# Patient Record
Sex: Female | Born: 1995 | Hispanic: Yes | Marital: Single | Smoking: Former smoker
Health system: Southern US, Community
[De-identification: ages and names within clinical notes are randomized; demographics above are authoritative.]

## PROBLEM LIST (undated history)

## (undated) DIAGNOSIS — A749 Chlamydial infection, unspecified: Secondary | ICD-10-CM

---

## 2017-09-21 ENCOUNTER — Other Ambulatory Visit: Payer: Self-pay

## 2017-09-21 ENCOUNTER — Emergency Department (HOSPITAL_COMMUNITY): Payer: Medicaid Other

## 2017-09-21 ENCOUNTER — Encounter (HOSPITAL_COMMUNITY): Payer: Self-pay | Admitting: *Deleted

## 2017-09-21 ENCOUNTER — Emergency Department (HOSPITAL_COMMUNITY)
Admission: EM | Admit: 2017-09-21 | Discharge: 2017-09-22 | Disposition: A | Discharge status: Home / Self Care | Payer: Medicaid Other | Attending: Emergency Medicine | Admitting: Emergency Medicine

## 2017-09-21 DIAGNOSIS — R1011 Right upper quadrant pain: Secondary | ICD-10-CM | POA: Diagnosis not present

## 2017-09-21 DIAGNOSIS — R109 Unspecified abdominal pain: Secondary | ICD-10-CM

## 2017-09-21 LAB — URINALYSIS, MICROSCOPIC (REFLEX): BACTERIA UA: NONE SEEN

## 2017-09-21 LAB — COMPREHENSIVE METABOLIC PANEL
ALBUMIN: 3.3 g/dL — AB (ref 3.5–5.0)
ALT: 11 U/L — ABNORMAL LOW (ref 14–54)
ANION GAP: 6 (ref 5–15)
AST: 23 U/L (ref 15–41)
Alkaline Phosphatase: 63 U/L (ref 38–126)
BUN: 6 mg/dL (ref 6–20)
CO2: 26 mmol/L (ref 22–32)
Calcium: 8.4 mg/dL — ABNORMAL LOW (ref 8.9–10.3)
Chloride: 104 mmol/L (ref 101–111)
Creatinine, Ser: 0.68 mg/dL (ref 0.44–1.00)
GFR calc Af Amer: >60 mL/min (ref >60)
GFR calc non Af Amer: >60 mL/min (ref >60)
GLUCOSE: 71 mg/dL (ref 65–99)
POTASSIUM: 3.3 mmol/L — AB (ref 3.5–5.1)
SODIUM: 136 mmol/L (ref 135–145)
Total Bilirubin: 0.4 mg/dL (ref 0.3–1.2)
Total Protein: 6.4 g/dL — ABNORMAL LOW (ref 6.5–8.1)

## 2017-09-21 LAB — CBC
HEMATOCRIT: 35.8 % — AB (ref 36.0–46.0)
HEMOGLOBIN: 11.4 g/dL — AB (ref 12.0–15.0)
MCH: 28.1 pg (ref 26.0–34.0)
MCHC: 31.8 g/dL (ref 30.0–36.0)
MCV: 88.2 fL (ref 78.0–100.0)
Platelets: 218 10*3/uL (ref 150–400)
RBC: 4.06 MIL/uL (ref 3.87–5.11)
RDW: 12.1 % (ref 11.5–15.5)
WBC: 10.3 10*3/uL (ref 4.0–10.5)

## 2017-09-21 LAB — URINALYSIS, ROUTINE W REFLEX MICROSCOPIC
BILIRUBIN URINE: NEGATIVE
Glucose, UA: 50 mg/dL — AB
Ketones, ur: NEGATIVE mg/dL
NITRITE: NEGATIVE
PH: 6 (ref 5.0–8.0)
Protein, ur: 100 mg/dL — AB
SPECIFIC GRAVITY, URINE: 1.009 (ref 1.005–1.030)

## 2017-09-21 LAB — LIPASE, BLOOD: LIPASE: 29 U/L (ref 11–51)

## 2017-09-21 LAB — I-STAT BETA HCG BLOOD, ED (MC, WL, AP ONLY): I-stat hCG, quantitative: <5 m[IU]/mL (ref <5)

## 2017-09-21 MED ORDER — MORPHINE SULFATE (PF) 4 MG/ML IV SOLN
4.0000 mg | Freq: Once | INTRAVENOUS | Status: AC
  Administered 2017-09-21: 4 mg via INTRAVENOUS
  Filled 2017-09-21: qty 1

## 2017-09-21 MED ORDER — ACETAMINOPHEN 500 MG PO TABS
1000.0000 mg | ORAL_TABLET | Freq: Once | ORAL | Status: AC
  Administered 2017-09-22: 1000 mg via ORAL
  Filled 2017-09-21: qty 2

## 2017-09-21 MED ORDER — GI COCKTAIL ~~LOC~~
30.0000 mL | Freq: Once | ORAL | Status: AC
  Administered 2017-09-21: 30 mL via ORAL
  Filled 2017-09-21: qty 30

## 2017-09-21 MED ORDER — KETOROLAC TROMETHAMINE 30 MG/ML IJ SOLN
30.0000 mg | Freq: Once | INTRAMUSCULAR | Status: AC
  Administered 2017-09-21: 30 mg via INTRAVENOUS
  Filled 2017-09-21: qty 1

## 2017-09-21 NOTE — ED Notes (Signed)
Patient ambulated to restroom for urine sample

## 2017-09-21 NOTE — ED Notes (Signed)
Patient transported to CT 

## 2017-09-21 NOTE — ED Provider Notes (Signed)
[facility] EMERGENCY DEPARTMENT Provider Note   CSN: [id number] Arrival date & time: 09/21/17  1616     History   Chief Complaint Chief Complaint  Patient presents with  . Abdominal Pain    HPI Kara Morrow is a 21 y.o. female.  21 year old healthy female who presents with right upper quadrant abdominal pain.  Patient reports 2 days of constant, sharp, severe right upper quadrant abdominal pain radiating to right flank.  The pain is worse with movement, coughing, deep breathing, and laughing.  She has never had pain like this before.  Pain is also worse when she eats.  She denies any associated nausea, vomiting, or diarrhea.  She has had some constipation.  No urinary symptoms.  She is currently menstruating, no abnormal vaginal bleeding or discharge.  She has had cough/cold symptoms recently with no fevers.  No shortness of breath. No h/o kidney stones.   The history is provided by the patient.  Abdominal Pain      History reviewed. No pertinent past medical history.  There are no active problems to display for this patient.   No past surgical history on file.  OB History    No data available       Home Medications    Prior to Admission medications   Not on File    Family History History reviewed. No pertinent family history.  Social History Social History   Tobacco Use  . Smoking status: Not on file  Substance Use Topics  . Alcohol use: Not on file  . Drug use: Not on file     Allergies   Patient has no known allergies.   Review of Systems Review of Systems  Gastrointestinal: Positive for abdominal pain.   All other systems reviewed and are negative except that which was mentioned in HPI   Physical Exam Updated Vital Signs BP 105/66   Pulse 82   Temp 99 F (37.2 C) (Oral)   Resp 20   Ht 5\' 2"  (1.575 m)   Wt 59 kg (130 lb)   LMP 09/21/2017   SpO2 100%   BMI 23.78 kg/m   Physical Exam  Constitutional: She is  oriented to person, place, and time. She appears well-developed and well-nourished. No distress.  uncomfortable  HENT:  Head: Normocephalic and atraumatic.  Moist mucous membranes  Eyes: Conjunctivae are normal. Pupils are equal, round, and reactive to light.  Neck: Neck supple.  Cardiovascular: Normal rate, regular rhythm and normal heart sounds.  No murmur heard. Pulmonary/Chest: Effort normal and breath sounds normal.  Abdominal: Soft. Bowel sounds are normal. She exhibits no distension. There is tenderness in the right upper quadrant. There is guarding (mild voluntary) and positive Murphy's sign. There is no rebound.  Musculoskeletal: She exhibits no edema.  + R CVA tenderness  Neurological: She is alert and oriented to person, place, and time.  Fluent speech  Skin: Skin is warm and dry.  Psychiatric: She has a normal mood and affect. Judgment normal.  Nursing note and vitals reviewed.    ED Treatments / Results  Labs (all labs ordered are listed, but only abnormal results are displayed) Labs Reviewed  COMPREHENSIVE METABOLIC PANEL - Abnormal; Notable for the following components:      Result Value   Potassium 3.3 (*)    Calcium 8.4 (*)    Total Protein 6.4 (*)    Albumin 3.3 (*)    ALT 11 (*)    All other components within  normal limits  CBC - Abnormal; Notable for the following components:   Hemoglobin 11.4 (*)    HCT 35.8 (*)    All other components within normal limits  URINALYSIS, ROUTINE W REFLEX MICROSCOPIC - Abnormal; Notable for the following components:   Color, Urine RED (*)    APPearance HAZY (*)    Glucose, UA 50 (*)    Hgb urine dipstick LARGE (*)    Protein, ur 100 (*)    Leukocytes, UA TRACE (*)    All other components within normal limits  URINALYSIS, MICROSCOPIC (REFLEX) - Abnormal; Notable for the following components:   Squamous Epithelial / LPF 0-5 (*)    All other components within normal limits  LIPASE, BLOOD  I-STAT BETA HCG BLOOD, ED ([facility],  [facility], [facility] ONLY)    EKG  EKG Interpretation None       Radiology Dg Chest 2 View  Result Date: 09/21/2017 CLINICAL DATA:  RIGHT-sided flank pain EXAM: CHEST  2 VIEW COMPARISON:  None. FINDINGS: Normal mediastinum and cardiac silhouette. Normal pulmonary vasculature. No evidence of effusion, infiltrate, or pneumothorax. No acute bony abnormality. IMPRESSION: Normal chest radiograph. Electronically Signed   By: Genevive BiStewart  Edmunds M.D.   On: 09/21/2017 18:11   Ct Renal Stone Study  Result Date: 09/21/2017 CLINICAL DATA:  21 year old female with right flank, abdominal and pelvic pain for 2 days. EXAM: CT ABDOMEN AND PELVIS WITHOUT CONTRAST TECHNIQUE: Multidetector CT imaging of the abdomen and pelvis was performed following the standard protocol without IV contrast. COMPARISON:  None. FINDINGS: Please note that parenchymal abnormalities may be missed without intravenous contrast. Lower chest: Unremarkable Hepatobiliary: The liver and gallbladder are unremarkable. No biliary dilatation. Pancreas: Unremarkable Spleen: Unremarkable Adrenals/Urinary Tract: The kidneys, adrenal glands and bladder are unremarkable. Stomach/Bowel: Stomach is within normal limits. No evidence of bowel wall thickening, distention, or inflammatory changes. The appendix is not identified but no definite inflammation is identified in the pericecal region. Vascular/Lymphatic: No significant vascular findings are present. No enlarged abdominal or pelvic lymph nodes. Reproductive: Uterus and bilateral adnexa are unremarkable. Other: No abdominal wall hernia or abnormality. No abdominopelvic ascites. Musculoskeletal: No acute or significant osseous findings. IMPRESSION: 1. No acute or significant abnormalities identified. Please note the appendix is not visualized but no definite inflammation is identified in the pericecal region. Electronically Signed   By: Harmon PierJeffrey  Hu M.D.   On: 09/21/2017 20:58   [place] Abdomen Limited Ruq  Result Date:  09/21/2017 CLINICAL DATA:  Two-day history of right upper quadrant pain. EXAM: ULTRASOUND ABDOMEN LIMITED RIGHT UPPER QUADRANT COMPARISON:  No comparison studies available. FINDINGS: Gallbladder: Gallbladder nondistended. No stones evident although underdistention lessen sensitivity for detection. Gallbladder wall thickness upper normal, likely accentuated by the under distention. Common bile duct: Diameter: 2-3 mm Liver: No focal lesion identified. Within normal limits in parenchymal echogenicity. Portal vein is patent on color Doppler imaging with normal direction of blood flow towards the liver. IMPRESSION: Unremarkable. No findings to explain the patient's history of right upper quadrant pain. Electronically Signed   By: Kennith CenterEric  Mansell M.D.   On: 09/21/2017 18:22    Procedures Procedures (including critical care time)  Medications Ordered in ED Medications  acetaminophen (TYLENOL) tablet 1,000 mg (not administered)  morphine 4 MG/ML injection 4 mg (4 mg Intravenous Given 09/21/17 1828)  ketorolac (TORADOL) 30 MG/ML injection 30 mg (30 mg Intravenous Given 09/21/17 2231)  gi cocktail (Maalox,Lidocaine,Donnatal) (30 mLs Oral Given 09/21/17 2234)     Initial Impression / Assessment and  Plan / ED Course  I have reviewed the triage vital signs and the nursing notes.  Pertinent labs & imaging results that were available during my care of the patient were reviewed by me and considered in my medical decision making (see chart for details).    Pt w/ 2 days of RUQ/R flank pain, recent cough.  Uncomfortable and nontoxic on exam with normal vital signs.  Focal right upper quadrant tenderness with CVA tenderness noted.  Obtained above lab work as well as right upper quadrant ultrasound, CXR and gave morphine for pain.DDx includes gallbladder pathology, kidney stone, or RLL pneumonia given recent cough.  Chest x-ray and ultrasound negative for acute process.  Patient continued to have pain, worst in  right upper quadrant but also some tenderness in right lower quadrant therefore obtained CT to evaluate for renal stone or acute appendicitis.  CT showed no acute findings to explain the patient's symptoms.  Because her pain worsens with certain movements, it is possible that she has musculoskeletal pain.  I have discussed supportive measures including heat therapy, NSAIDs, and Tylenol.  Given her reassuring lab work, lack of other symptoms I feel she is safe for discharge.  I have extensively reviewed return precautions.  Patient voiced understanding and was discharged in satisfactory condition.  Final Clinical Impressions(s) / ED Diagnoses   Final diagnoses:  RUQ pain  Right sided abdominal pain  Right flank pain    ED Discharge Orders    None       Theona Muhs, Ambrose Finlandachel Morgan, MD 09/21/17 2352

## 2017-09-21 NOTE — ED Triage Notes (Signed)
Pt in c/o RLQ abd pain that started two days ago, worse today, denies fever or n/v, pain is worse with movement

## 2017-09-21 NOTE — ED Notes (Signed)
Patient transported to x-ray. ?

## 2017-09-22 ENCOUNTER — Telehealth: Payer: Self-pay | Admitting: *Deleted

## 2017-09-22 NOTE — ED Notes (Signed)
Pt departed in NAD, refused use of wheelchair.  

## 2017-09-22 NOTE — Telephone Encounter (Signed)
Pt called related to Rx.  Pt states she was told that something would be called in to her pharmacy for pain.  EDCM reviewed chart to find that EDP advised to take OTC NSAIDS and tylenol.  EDCM reminded pt of advice and suggested to visit her PCP if more pain meds needed.

## 2017-09-26 ENCOUNTER — Encounter (HOSPITAL_COMMUNITY): Payer: Self-pay | Admitting: Emergency Medicine

## 2017-09-26 ENCOUNTER — Emergency Department (HOSPITAL_COMMUNITY)
Admission: EM | Admit: 2017-09-26 | Discharge: 2017-09-26 | Disposition: A | Discharge status: Home / Self Care | Payer: Medicaid Other | Attending: Emergency Medicine | Admitting: Emergency Medicine

## 2017-09-26 DIAGNOSIS — N390 Urinary tract infection, site not specified: Secondary | ICD-10-CM | POA: Diagnosis not present

## 2017-09-26 DIAGNOSIS — N730 Acute parametritis and pelvic cellulitis: Secondary | ICD-10-CM

## 2017-09-26 DIAGNOSIS — F17210 Nicotine dependence, cigarettes, uncomplicated: Secondary | ICD-10-CM | POA: Diagnosis not present

## 2017-09-26 DIAGNOSIS — R102 Pelvic and perineal pain: Secondary | ICD-10-CM | POA: Diagnosis present

## 2017-09-26 DIAGNOSIS — N739 Female pelvic inflammatory disease, unspecified: Secondary | ICD-10-CM | POA: Insufficient documentation

## 2017-09-26 LAB — URINALYSIS, ROUTINE W REFLEX MICROSCOPIC
Glucose, UA: NEGATIVE mg/dL
KETONES UR: 15 mg/dL — AB
Nitrite: POSITIVE — AB
PH: 8 (ref 5.0–8.0)
PROTEIN: 30 mg/dL — AB
Specific Gravity, Urine: >1.03 — ABNORMAL HIGH (ref 1.005–1.030)

## 2017-09-26 LAB — CBC
HEMATOCRIT: 31.1 % — AB (ref 36.0–46.0)
HEMOGLOBIN: 9.6 g/dL — AB (ref 12.0–15.0)
MCH: 27.1 pg (ref 26.0–34.0)
MCHC: 30.9 g/dL (ref 30.0–36.0)
MCV: 87.9 fL (ref 78.0–100.0)
Platelets: 421 10*3/uL — ABNORMAL HIGH (ref 150–400)
RBC: 3.54 MIL/uL — AB (ref 3.87–5.11)
RDW: 12.3 % (ref 11.5–15.5)
WBC: 12.3 10*3/uL — ABNORMAL HIGH (ref 4.0–10.5)

## 2017-09-26 LAB — URINALYSIS, MICROSCOPIC (REFLEX)

## 2017-09-26 LAB — WET PREP, GENITAL
Clue Cells Wet Prep HPF POC: NONE SEEN
SPERM: NONE SEEN
YEAST WET PREP: NONE SEEN

## 2017-09-26 LAB — POC URINE PREG, ED: PREG TEST UR: NEGATIVE

## 2017-09-26 MED ORDER — DOXYCYCLINE HYCLATE 100 MG PO TABS
100.0000 mg | ORAL_TABLET | Freq: Once | ORAL | Status: AC
  Administered 2017-09-26: 100 mg via ORAL
  Filled 2017-09-26: qty 1

## 2017-09-26 MED ORDER — KETOROLAC TROMETHAMINE 30 MG/ML IJ SOLN
30.0000 mg | Freq: Once | INTRAMUSCULAR | Status: AC
  Administered 2017-09-26: 30 mg via INTRAVENOUS
  Filled 2017-09-26: qty 1

## 2017-09-26 MED ORDER — METRONIDAZOLE 500 MG PO TABS
2000.0000 mg | ORAL_TABLET | Freq: Once | ORAL | Status: AC
  Administered 2017-09-26: 2000 mg via ORAL
  Filled 2017-09-26: qty 4

## 2017-09-26 MED ORDER — CEFTRIAXONE SODIUM 250 MG IJ SOLR
250.0000 mg | Freq: Once | INTRAMUSCULAR | Status: AC
  Administered 2017-09-26: 250 mg via INTRAMUSCULAR
  Filled 2017-09-26: qty 250

## 2017-09-26 MED ORDER — DOXYCYCLINE HYCLATE 100 MG PO CAPS: 100.0000 mg | ORAL_CAPSULE | Freq: Two times a day (BID) | ORAL | 0 refills | Status: AC

## 2017-09-26 MED ORDER — IBUPROFEN 600 MG PO TABS: 600.0000 mg | ORAL_TABLET | Freq: Four times a day (QID) | ORAL | 0 refills | Status: DC | PRN

## 2017-09-26 MED ORDER — LIDOCAINE HCL (PF) 1 % IJ SOLN
INTRAMUSCULAR | Status: AC
  Filled 2017-09-26: qty 5

## 2017-09-26 MED ORDER — CEPHALEXIN 500 MG PO CAPS: 500.0000 mg | ORAL_CAPSULE | Freq: Two times a day (BID) | ORAL | 0 refills | Status: AC

## 2017-09-26 MED ORDER — CEPHALEXIN 250 MG PO CAPS
500.0000 mg | ORAL_CAPSULE | Freq: Once | ORAL | Status: AC
  Administered 2017-09-26: 500 mg via ORAL
  Filled 2017-09-26: qty 2

## 2017-09-26 NOTE — Discharge Instructions (Signed)
Please read and follow all provided instructions.  Your diagnoses today include:  1. PID (acute pelvic inflammatory disease)   2. Urinary tract infection without hematuria, site unspecified     Tests performed today include: Vital signs. See below for your results today.   Medications prescribed:  Take as prescribed   Home care instructions:  Follow any educational materials contained in this packet.  Follow-up instructions: Please follow-up with your OBGYN for further evaluation of symptoms and treatment   Return instructions:  Please return to the Emergency Department if you do not get better, if you get worse, or new symptoms OR  - Fever (temperature greater than 101.20F)  - Bleeding that does not stop with holding pressure to the area    -Severe pain (please note that you may be more sore the day after your accident)  - Chest Pain  - Difficulty breathing  - Severe nausea or vomiting  - Inability to tolerate food and liquids  - Passing out  - Skin becoming red around your wounds  - Change in mental status (confusion or lethargy)  - New numbness or weakness    Please return if you have any other emergent concerns.  Additional Information:  Your vital signs today were: BP 117/67 (BP Location: Right Arm)    Pulse 92    Temp 99.1 F (37.3 C) (Oral)    Resp 18    LMP 09/21/2017    SpO2 97%  If your blood pressure (BP) was elevated above 135/85 this visit, please have this repeated by your doctor within one month. ---------------

## 2017-09-26 NOTE — ED Provider Notes (Signed)
[facility] Provider Note   CSN: [id number] Arrival date & time: 09/26/17  1647     History   Chief Complaint Chief Complaint  Patient presents with  . Abdominal Pain  . Vaginal Bleeding    HPI Kara MeckelJessica Bardwell is a 21 y.o. female.  HPI  21 y.o. female, presents to the Emergency Department today due to bilateral lower pelvic pain x 1 month. Notes intermittent pain with worsening the past 3 days. Pt states she just finished her "normal" menstrual cycle until she began bleeding again. Notes spotting initially, but now notes copious amounts of blood as well as passage of clots. Pt concerned due to hx anemia with losing so much blood. Rates pain 8/10 and located more on the right side. Notes pain for one month. NO worsening of symptoms. Denies discharge. No N/V. No diarrhea. No CP/SOB. Pt sexually active with one partner. No other symptoms noted   History reviewed. No pertinent past medical history.  There are no active problems to display for this patient.   History reviewed. No pertinent surgical history.  OB History    No data available       Home Medications    Prior to Admission medications   Not on File    Family History History reviewed. No pertinent family history.  Social History Social History   Tobacco Use  . Smoking status: Current Some Day Smoker    Packs/day: 0.50    Types: Cigarettes  . Smokeless tobacco: Never Used  Substance Use Topics  . Alcohol use: No    Frequency: Never  . Drug use: Yes    Types: Marijuana     Allergies   Patient has no known allergies.   Review of Systems Review of Systems ROS reviewed and all are negative for acute change except as noted in the HPI.  Physical Exam Updated Vital Signs BP 117/67 (BP Location: Right Arm)   Pulse 92   Temp 99.1 F (37.3 C) (Oral)   Resp 18   LMP 09/21/2017   SpO2 97%   Physical Exam  Constitutional: She is oriented to person, place, and  time. Vital signs are normal. She appears well-developed and well-nourished.  HENT:  Head: Normocephalic.  Right Ear: Hearing normal.  Left Ear: Hearing normal.  Eyes: Conjunctivae and EOM are normal. Pupils are equal, round, and reactive to light.  Neck: Normal range of motion. Neck supple.  Cardiovascular: Normal rate, regular rhythm, normal heart sounds and intact distal pulses.  Pulmonary/Chest: Effort normal and breath sounds normal.  Abdominal: Soft. There is no tenderness. There is no rigidity, no rebound, no guarding, no CVA tenderness, no tenderness at McBurney's point and negative Murphy's sign.  Musculoskeletal: Normal range of motion.  Neurological: She is alert and oriented to person, place, and time.  Skin: Skin is warm and dry.  Psychiatric: She has a normal mood and affect. Her speech is normal and behavior is normal. Thought content normal.  Nursing note and vitals reviewed.  Exam performed by Eston Estersyler M Ambreen Tufte,  exam chaperoned Date: 09/26/2017 Pelvic exam: normal external genitalia without evidence of trauma. VULVA: normal appearing vulva with no masses, tenderness or lesion. VAGINA: normal appearing vagina with normal color and discharge, no lesions. CERVIX: normal appearing cervix without lesions, cervical motion tenderness absent, cervical os closed with out purulent discharge; vaginal discharge - bloody, Wet prep and DNA probe for chlamydia and GC obtained.   ADNEXA: normal adnexa in size, nontender and  no masses UTERUS: uterus is normal size, shape, consistency and nontender.    ED Treatments / Results  Labs (all labs ordered are listed, but only abnormal results are displayed) Labs Reviewed  WET PREP, GENITAL - Abnormal; Notable for the following components:      Result Value   Trich, Wet Prep POSITIVE (*)    WBC, Wet Prep HPF POC POSITIVE (*)    All other components within normal limits  URINALYSIS, ROUTINE W REFLEX MICROSCOPIC - Abnormal; Notable for the  following components:   Color, Urine BROWN (*)    APPearance TURBID (*)    Specific Gravity, Urine >1.030 (*)    Hgb urine dipstick LARGE (*)    Bilirubin Urine SMALL (*)    Ketones, ur 15 (*)    Protein, ur 30 (*)    Nitrite POSITIVE (*)    Leukocytes, UA TRACE (*)    All other components within normal limits  CBC - Abnormal; Notable for the following components:   WBC 12.3 (*)    RBC 3.54 (*)    Hemoglobin 9.6 (*)    HCT 31.1 (*)    Platelets 421 (*)    All other components within normal limits  URINALYSIS, MICROSCOPIC (REFLEX) - Abnormal; Notable for the following components:   Bacteria, UA FEW (*)    Squamous Epithelial / LPF 6-30 (*)    All other components within normal limits  POC URINE PREG, ED  GC/CHLAMYDIA PROBE AMP ([place]    EKG  EKG Interpretation None       Radiology No results found.  Procedures Procedures (including critical care time)  Medications Ordered in ED Medications  metroNIDAZOLE (FLAGYL) tablet 2,000 mg (not administered)  doxycycline (VIBRA-TABS) tablet 100 mg (not administered)  cephALEXin (KEFLEX) capsule 500 mg (not administered)  cefTRIAXone (ROCEPHIN) injection 250 mg (not administered)  ketorolac (TORADOL) 30 MG/ML injection 30 mg (30 mg Intravenous Given 09/26/17 1914)     Initial Impression / Assessment and Plan / ED Course  I have reviewed the triage vital signs and the nursing notes.  Pertinent labs & imaging results that were available during my care of the patient were reviewed by me and considered in my medical decision making (see chart for details).  Final Clinical Impressions(s) / ED Diagnoses  {I have reviewed and evaluated the relevant laboratory values.   {I have reviewed the relevant previous healthcare records.  {I obtained HPI from historian.   ED Course:  Assessment: Pt is a 21 y.o. female presents to the Emergency Department today due to bilateral lower pelvic pain x 1 month. Notes  intermittent pain with worsening the past 3 days. Pt states she just finished her "normal" menstrual cycle until she began bleeding again. Notes spotting initially, but now notes copious amounts of blood as well as passage of clots. Pt concerned due to hx anemia with losing so much blood. Rates pain 8/10 and located more on the right side. Notes pain for one month. NO worsening of symptoms. Denies discharge. No N/V. No diarrhea. No CP/SOB. Pt sexually active with one partner. On exam, pt in NAD. Nontoxic/nonseptic appearing. VSS. Afebrile. Lungs CTA. Heart RRR. Abdomen nontender soft. GU Exam with minimal blood noted from cervix. No Adnexal. No CMT. Wet prep shows Trich and WBCs. Pregnancy negative. GC obtained. UA with evidence of UTI. CBC with stable Hgb. Encouraged Iron supplements due to drop from previous and close follow up to OBGYN. Will treat for PID given  trich and abdominal pain with vaginal bleeding. Plan is to DC home with follow up to GYN. At time of discharge, Patient is in no acute distress. Vital Signs are stable. Patient is able to ambulate. Patient able to tolerate PO.   Disposition/Plan:  DC Home Additional Verbal discharge instructions given and discussed with patient.  Pt Instructed to f/u with GYN in the next week for evaluation and treatment of symptoms. Return precautions given Pt acknowledges and agrees with plan  Supervising Physician Cardama, Amadeo Garnet, *  Final diagnoses:  PID (acute pelvic inflammatory disease)  Urinary tract infection without hematuria, site unspecified    ED Discharge Orders    None       Audry Pili, PA-C 09/26/17 1956    Nira Conn, MD 09/27/17 [phone]

## 2017-09-26 NOTE — ED Triage Notes (Signed)
Pt to ER for evaluation of lower abd pain and menstrual bleeding, states began menstrual cycle 3 days ago. VSS.

## 2017-09-27 LAB — GC/CHLAMYDIA PROBE AMP (~~LOC~~) NOT AT ARMC
CHLAMYDIA, DNA PROBE: POSITIVE — AB
NEISSERIA GONORRHEA: NEGATIVE

## 2018-03-25 ENCOUNTER — Other Ambulatory Visit: Payer: Self-pay

## 2018-03-25 ENCOUNTER — Encounter (HOSPITAL_COMMUNITY): Payer: Self-pay | Admitting: Emergency Medicine

## 2018-03-25 ENCOUNTER — Emergency Department (HOSPITAL_COMMUNITY): Payer: Medicaid Other

## 2018-03-25 ENCOUNTER — Emergency Department (HOSPITAL_COMMUNITY)
Admission: EM | Admit: 2018-03-25 | Discharge: 2018-03-25 | Disposition: A | Discharge status: Home / Self Care | Payer: Medicaid Other | Attending: Emergency Medicine | Admitting: Emergency Medicine

## 2018-03-25 DIAGNOSIS — R1030 Lower abdominal pain, unspecified: Secondary | ICD-10-CM | POA: Diagnosis present

## 2018-03-25 DIAGNOSIS — N390 Urinary tract infection, site not specified: Secondary | ICD-10-CM | POA: Diagnosis not present

## 2018-03-25 DIAGNOSIS — F17210 Nicotine dependence, cigarettes, uncomplicated: Secondary | ICD-10-CM | POA: Insufficient documentation

## 2018-03-25 DIAGNOSIS — Z79899 Other long term (current) drug therapy: Secondary | ICD-10-CM | POA: Diagnosis not present

## 2018-03-25 LAB — URINALYSIS, ROUTINE W REFLEX MICROSCOPIC
Bilirubin Urine: NEGATIVE
GLUCOSE, UA: NEGATIVE mg/dL
Ketones, ur: NEGATIVE mg/dL
Nitrite: NEGATIVE
Protein, ur: 30 mg/dL — AB
Specific Gravity, Urine: 1.014 (ref 1.005–1.030)
WBC, UA: >50 WBC/hpf — ABNORMAL HIGH (ref 0–5)
pH: 8 (ref 5.0–8.0)

## 2018-03-25 LAB — COMPREHENSIVE METABOLIC PANEL
ALT: 15 U/L (ref 14–54)
AST: 21 U/L (ref 15–41)
Albumin: 3.6 g/dL (ref 3.5–5.0)
Alkaline Phosphatase: 65 U/L (ref 38–126)
Anion gap: 6 (ref 5–15)
BUN: 10 mg/dL (ref 6–20)
CHLORIDE: 106 mmol/L (ref 101–111)
CO2: 26 mmol/L (ref 22–32)
CREATININE: 0.75 mg/dL (ref 0.44–1.00)
Calcium: 9.1 mg/dL (ref 8.9–10.3)
GFR calc Af Amer: >60 mL/min (ref >60)
Glucose, Bld: 97 mg/dL (ref 65–99)
Potassium: 4.2 mmol/L (ref 3.5–5.1)
Sodium: 138 mmol/L (ref 135–145)
Total Bilirubin: 0.3 mg/dL (ref 0.3–1.2)
Total Protein: 6.9 g/dL (ref 6.5–8.1)

## 2018-03-25 LAB — CBC WITH DIFFERENTIAL/PLATELET
Abs Immature Granulocytes: 0 10*3/uL (ref 0.0–0.1)
Basophils Absolute: 0 10*3/uL (ref 0.0–0.1)
Basophils Relative: 1 %
EOS ABS: 0.1 10*3/uL (ref 0.0–0.7)
Eosinophils Relative: 1 %
HEMATOCRIT: 38.2 % (ref 36.0–46.0)
Hemoglobin: 11.8 g/dL — ABNORMAL LOW (ref 12.0–15.0)
Immature Granulocytes: 0 %
LYMPHS ABS: 0.8 10*3/uL (ref 0.7–4.0)
Lymphocytes Relative: 13 %
MCH: 27.3 pg (ref 26.0–34.0)
MCHC: 30.9 g/dL (ref 30.0–36.0)
MCV: 88.4 fL (ref 78.0–100.0)
MONOS PCT: 10 %
Monocytes Absolute: 0.6 10*3/uL (ref 0.1–1.0)
Neutro Abs: 4.4 10*3/uL (ref 1.7–7.7)
Neutrophils Relative %: 75 %
Platelets: 218 10*3/uL (ref 150–400)
RBC: 4.32 MIL/uL (ref 3.87–5.11)
RDW: 13.9 % (ref 11.5–15.5)
WBC: 5.8 10*3/uL (ref 4.0–10.5)

## 2018-03-25 LAB — POC URINE PREG, ED: PREG TEST UR: NEGATIVE

## 2018-03-25 MED ORDER — CEPHALEXIN 500 MG PO CAPS: 500.0000 mg | ORAL_CAPSULE | Freq: Four times a day (QID) | ORAL | 0 refills | Status: DC

## 2018-03-25 MED ORDER — ACETAMINOPHEN 500 MG PO TABS
1000.0000 mg | ORAL_TABLET | Freq: Once | ORAL | Status: AC
  Administered 2018-03-25: 1000 mg via ORAL
  Filled 2018-03-25: qty 2

## 2018-03-25 MED ORDER — PHENAZOPYRIDINE HCL 200 MG PO TABS: 200.0000 mg | ORAL_TABLET | Freq: Three times a day (TID) | ORAL | 0 refills | Status: DC | PRN

## 2018-03-25 NOTE — Discharge Instructions (Signed)
Stay very well hydrated with plenty of water throughout the day. Please take antibiotic until completion. Use pyridium as directed to decrease painful urination but know that a common side effect is to turn your urine a bright orange/red color. This is not a harmful side effect. Follow up with primary care physician in 1 week for recheck of ongoing symptoms.  Please seek immediate care if you develop the following: Your symptoms are no better or worse in 3 days. There is severe back pain or lower abdominal pain.  You have a fever.  There is vomiting.  You have any additional concerns.

## 2018-03-25 NOTE — ED Provider Notes (Signed)
[facility] EMERGENCY DEPARTMENT Provider Note   CSN: [id number] Arrival date & time: 03/25/18  1922     History   Chief Complaint Chief Complaint  Patient presents with  . URI    HPI Kara Morrow is a 22 y.o. female.  The history is provided by the patient and medical records. No language interpreter was used.   Kara Morrow is a 22 y.o. female  who presents to the Emergency Department complaining of lower abdominal pain over the last 3 days associated with urinary frequency.  She states that she has had a cold over the last week. Her symptoms are improving.  She still has a cough.  When she coughs, she notices worsening of her lower abdominal pain.  Intermittent nausea, but no vomiting.  Denies back pain.  No fever or chills.  She took over-the-counter UTI medication with little improvement in her symptoms. No vaginal discharge.   History reviewed. No pertinent past medical history.  There are no active problems to display for this patient.   History reviewed. No pertinent surgical history.   OB History   None      Home Medications    Prior to Admission medications   Medication Sig Start Date End Date Taking? Authorizing Provider  cephALEXin (KEFLEX) 500 MG capsule Take 1 capsule (500 mg total) by mouth 4 (four) times daily. 03/25/18   Paisly Fingerhut, Chase Picket, PA-C  ibuprofen (ADVIL,MOTRIN) 600 MG tablet Take 1 tablet (600 mg total) by mouth every 6 (six) hours as needed. 09/26/17   Audry Pili, PA-C  phenazopyridine (PYRIDIUM) 200 MG tablet Take 1 tablet (200 mg total) by mouth 3 (three) times daily as needed (Burning with urination). 03/25/18   Leanthony Rhett, Chase Picket, PA-C    Family History No family history on file.  Social History Social History   Tobacco Use  . Smoking status: Current Some Day Smoker    Packs/day: 0.50    Types: Cigarettes  . Smokeless tobacco: Never Used  Substance Use Topics  . Alcohol use: No    Frequency: Never  . Drug  use: Yes    Types: Marijuana     Allergies   Patient has no known allergies.   Review of Systems Review of Systems  Constitutional: Negative for fever.  Genitourinary: Positive for dysuria, frequency and urgency. Negative for vaginal discharge.  All other systems reviewed and are negative.    Physical Exam Updated Vital Signs BP 117/70 (BP Location: Right Arm)   Pulse 84   Temp 99.5 F (37.5 C) (Oral)   Resp 16   SpO2 100%   Physical Exam  Constitutional: She is oriented to person, place, and time. She appears well-developed and well-nourished. No distress.  Nontoxic-appearing.  HENT:  Head: Normocephalic and atraumatic.  Cardiovascular: Normal rate, regular rhythm and normal heart sounds.  No murmur heard. Pulmonary/Chest: Effort normal and breath sounds normal. No respiratory distress.  Lungs clear to auscultation bilaterally.  Abdominal: Soft. Bowel sounds are normal. She exhibits no distension.  Suprapubic tenderness.  No focal tenderness at McBurney's point.  No CVA tenderness.  Neurological: She is alert and oriented to person, place, and time.  Skin: Skin is warm and dry.  Nursing note and vitals reviewed.    ED Treatments / Results  Labs (all labs ordered are listed, but only abnormal results are displayed) Labs Reviewed  URINALYSIS, ROUTINE W REFLEX MICROSCOPIC - Abnormal; Notable for the following components:      Result Value  APPearance CLOUDY (*)    Hgb urine dipstick SMALL (*)    Protein, ur 30 (*)    Leukocytes, UA LARGE (*)    WBC, UA >50 (*)    Bacteria, UA RARE (*)    All other components within normal limits  CBC WITH DIFFERENTIAL/PLATELET - Abnormal; Notable for the following components:   Hemoglobin 11.8 (*)    All other components within normal limits  COMPREHENSIVE METABOLIC PANEL  POC URINE PREG, ED    EKG None  Radiology Dg Chest 2 View  Result Date: 03/25/2018 CLINICAL DATA:  URI, cough EXAM: CHEST - 2 VIEW COMPARISON:   09/21/2017 FINDINGS: The heart size and mediastinal contours are within normal limits. Both lungs are clear. The visualized skeletal structures are unremarkable. IMPRESSION: No active cardiopulmonary disease. Electronically Signed   By: Jasmine Pang M.D.   On: 03/25/2018 20:05    Procedures Procedures (including critical care time)  Medications Ordered in ED Medications  acetaminophen (TYLENOL) tablet 1,000 mg (has no administration in time range)     Initial Impression / Assessment and Plan / ED Course  I have reviewed the triage vital signs and the nursing notes.  Pertinent labs & imaging results that were available during my care of the patient were reviewed by me and considered in my medical decision making (see chart for details).    Kara Morrow is a 22 y.o. female who presents to ED for dysuria, urinary urgency and frequency of the last 3 days.  She reported having URI symptoms to triage.  She states the symptoms have improved, however she has a dry cough that has persisted.  She stated that her cough makes her abdominal pain worse.  She did get a chest x-ray in triage with no acute findings.  Her lungs are clear to auscultation bilaterally.  She has suprapubic tenderness without rebound or guarding.  There is no CVA or flank tenderness.  She is nontoxic-appearing.  UA reviewed showing large leukocytes with greater than 50 white cells.  She denies vaginal discharge or concerns for STDs.  She does not want a pelvic exam today.  Labs reviewed and reassuring.  Normal white count.  Will treat UTI with Keflex and have her follow-up with PCP or OB/GYN if symptoms persist.  Reasons to return to the emergency department including fever, vomiting, worsening symptoms, no improvement in 3 days, or any additional concerns were discussed with the patient.  All questions answered.  Final Clinical Impressions(s) / ED Diagnoses   Final diagnoses:  Lower urinary tract infection    ED Discharge  Orders        Ordered    cephALEXin (KEFLEX) 500 MG capsule  4 times daily     03/25/18 2201    phenazopyridine (PYRIDIUM) 200 MG tablet  3 times daily PRN     03/25/18 2201       Teofila Bowery, Chase Picket, PA-C 03/25/18 2208    Terrilee Files, MD 03/26/18 1144

## 2018-03-25 NOTE — ED Triage Notes (Signed)
Pt has had cough and respiratory infection.  Has had increasing right sided pain with pain worse with inspiration. Pt also took a home urinary tract infection test that was positive yesterday but again today was negative.

## 2018-05-12 ENCOUNTER — Emergency Department (HOSPITAL_COMMUNITY): Payer: Medicaid Other

## 2018-05-12 ENCOUNTER — Encounter (HOSPITAL_COMMUNITY): Payer: Self-pay

## 2018-05-12 ENCOUNTER — Emergency Department (HOSPITAL_COMMUNITY)
Admission: EM | Admit: 2018-05-12 | Discharge: 2018-05-12 | Disposition: A | Discharge status: Home / Self Care | Payer: Medicaid Other | Attending: Emergency Medicine | Admitting: Emergency Medicine

## 2018-05-12 ENCOUNTER — Other Ambulatory Visit: Payer: Self-pay

## 2018-05-12 DIAGNOSIS — R51 Headache: Secondary | ICD-10-CM | POA: Insufficient documentation

## 2018-05-12 DIAGNOSIS — Y929 Unspecified place or not applicable: Secondary | ICD-10-CM | POA: Diagnosis not present

## 2018-05-12 DIAGNOSIS — Y999 Unspecified external cause status: Secondary | ICD-10-CM | POA: Diagnosis not present

## 2018-05-12 DIAGNOSIS — F17210 Nicotine dependence, cigarettes, uncomplicated: Secondary | ICD-10-CM | POA: Insufficient documentation

## 2018-05-12 DIAGNOSIS — Y939 Activity, unspecified: Secondary | ICD-10-CM | POA: Insufficient documentation

## 2018-05-12 MED ORDER — IBUPROFEN 800 MG PO TABS: 800.0000 mg | ORAL_TABLET | Freq: Three times a day (TID) | ORAL | 0 refills | Status: AC

## 2018-05-12 MED ORDER — IBUPROFEN 800 MG PO TABS
800.0000 mg | ORAL_TABLET | Freq: Once | ORAL | Status: AC
  Administered 2018-05-12: 800 mg via ORAL
  Filled 2018-05-12: qty 1
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
## 2018-05-12 NOTE — Discharge Instructions (Addendum)
Head scan and chest x-ray look good. No skull fractures or brain bleeding. Also I don't see any rib fractures.   Make sure you take it easy and rest for the next couple of days. Like I mentioned, don't be surprised if you are more sore tomorrow. You may take this ibuprofen I prescribed with/without Tylenol. Also you can apply ice to your head in 10-15 minute intervals to help with the swelling.

## 2018-05-12 NOTE — ED Notes (Signed)
PT DISCHARGED. INSTRUCTIONS AND PRESCRIPTION GIVEN. AAOX4. PT IN NO APPARENT DISTRESS WITH MODERATE PAIN. THE OPPORTUNITY TO ASK QUESTIONS WAS PROVIDED. 
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
## 2018-10-17 ENCOUNTER — Emergency Department (HOSPITAL_COMMUNITY)
Admission: EM | Admit: 2018-10-17 | Discharge: 2018-10-17 | Disposition: A | Discharge status: Home / Self Care | Payer: Medicaid Other | Attending: Emergency Medicine | Admitting: Emergency Medicine

## 2018-10-17 ENCOUNTER — Encounter (HOSPITAL_COMMUNITY): Payer: Self-pay

## 2018-10-17 DIAGNOSIS — B9689 Other specified bacterial agents as the cause of diseases classified elsewhere: Secondary | ICD-10-CM

## 2018-10-17 DIAGNOSIS — F17210 Nicotine dependence, cigarettes, uncomplicated: Secondary | ICD-10-CM | POA: Insufficient documentation

## 2018-10-17 DIAGNOSIS — Z79899 Other long term (current) drug therapy: Secondary | ICD-10-CM | POA: Diagnosis not present

## 2018-10-17 DIAGNOSIS — N760 Acute vaginitis: Secondary | ICD-10-CM | POA: Insufficient documentation

## 2018-10-17 DIAGNOSIS — N898 Other specified noninflammatory disorders of vagina: Secondary | ICD-10-CM | POA: Diagnosis present

## 2018-10-17 LAB — I-STAT BETA HCG BLOOD, ED (MC, WL, AP ONLY)

## 2018-10-17 LAB — URINALYSIS, ROUTINE W REFLEX MICROSCOPIC
BACTERIA UA: NONE SEEN
BILIRUBIN URINE: NEGATIVE
Glucose, UA: NEGATIVE mg/dL
KETONES UR: 5 mg/dL — AB
Leukocytes, UA: NEGATIVE
Nitrite: NEGATIVE
PROTEIN: NEGATIVE mg/dL
Specific Gravity, Urine: 1.008 (ref 1.005–1.030)
pH: 7 (ref 5.0–8.0)

## 2018-10-17 LAB — WET PREP, GENITAL
Sperm: NONE SEEN
Trich, Wet Prep: NONE SEEN
YEAST WET PREP: NONE SEEN

## 2018-10-17 MED ORDER — METRONIDAZOLE 500 MG PO TABS: 500.0000 mg | ORAL_TABLET | Freq: Two times a day (BID) | ORAL | 0 refills | Status: DC

## 2018-10-17 NOTE — ED Triage Notes (Signed)
Pt presents with 2 day h/o vaginal discharge that pt reports is yellow and foul smelling.  Pt denies any dysuria or abdominal pain.

## 2018-10-17 NOTE — ED Notes (Signed)
Pt requested that no results, diagnosis be discussed in front of her boyfriend.

## 2018-10-17 NOTE — ED Provider Notes (Signed)
[facility] EMERGENCY [facility]PART[facility] Provider Note   CSN: [id number] Arrival date & time: 10/17/18  1427     History   Chief Complaint No chief complaint on file.   HPI Kara Morrow is a 22 y.o. female.  HPI Patient is had some vaginal discharge.  Last couple days.  Yellow.  Mild smell to it.  No pain.  No dysuria.  No abdominal pain.  Denies current risky sexual activity but states she has not been checked since an old boyfriend who cheated on her.  No bleeding.  No pain with sex.  Has had bacterial vaginosis previously. History reviewed. No pertinent past medical history.  There are no active problems to display for this patient.   History reviewed. No pertinent surgical history.   OB History   None      Home Medications    Prior to Admission medications   Medication Sig Start Date End Date Taking? Authorizing Provider  cephALEXin (KEFLEX) 500 MG capsule Take 1 capsule (500 mg total) by mouth 4 (four) times daily. 03/25/18   Ward, Chase PicketJaime Pilcher, PA-C  metroNIDAZOLE (FLAGYL) 500 MG tablet Take 1 tablet (500 mg total) by mouth 2 (two) times daily. 10/17/18   Benjiman CorePickering, Mikel Pyon, MD  phenazopyridine (PYRIDIUM) 200 MG tablet Take 1 tablet (200 mg total) by mouth 3 (three) times daily as needed (Burning with urination). 03/25/18   Ward, Chase PicketJaime Pilcher, PA-C    Family History History reviewed. No pertinent family history.  Social History Social History   Tobacco Use  . Smoking status: Current Some Day Smoker    Packs/day: 0.50    Types: Cigarettes  . Smokeless tobacco: Never Used  Substance Use Topics  . Alcohol use: No    Frequency: Never  . Drug use: Yes    Types: Marijuana     Allergies   Patient has no known allergies.   Review of Systems Review of Systems  Constitutional: Negative for appetite change.  Respiratory: Negative for shortness of breath.   Cardiovascular: Negative for chest pain.  Gastrointestinal: Negative for abdominal pain.   Genitourinary: Positive for vaginal discharge. Negative for dysuria, flank pain, vaginal bleeding and vaginal pain.  Musculoskeletal: Negative for back pain.  Skin: Negative for rash.  Neurological: Negative for seizures and weakness.  Hematological: Negative for adenopathy.  Psychiatric/Behavioral: Negative for confusion.     Physical Exam Updated Vital Signs BP 129/86 (BP Location: Right Arm)   Pulse 81   Temp 98.2 F (36.8 C) (Oral)   Resp 16   Ht 5\' 3"  (1.6 m)   Wt 77.1 kg   LMP 10/14/2018 (Exact Date)   SpO2 100%   BMI 30.11 kg/m   Physical Exam  Constitutional: She appears well-developed.  HENT:  Head: Normocephalic.  Neck: No JVD present.  Cardiovascular: Normal rate.  Pulmonary/Chest: She has no wheezes.  Abdominal: There is no tenderness.  Genitourinary:  Genitourinary Comments: Minimal vaginal discharge.  Mild redness of cervix.  No adnexal tenderness.  No cervical motion tenderness.  Musculoskeletal: She exhibits no edema.  Skin: Skin is warm. Capillary refill takes less than 2 seconds.     ED Treatments / Results  Labs (all labs ordered are listed, but only abnormal results are displayed) Labs Reviewed  WET PREP, GENITAL - Abnormal; Notable for the following components:      Result Value   Clue Cells Wet Prep HPF POC PRESENT (*)    WBC, Wet Prep HPF POC MANY (*)  All other components within normal limits  URINALYSIS, ROUTINE W REFLEX MICROSCOPIC - Abnormal; Notable for the following components:   Color, Urine STRAW (*)    Hgb urine dipstick MODERATE (*)    Ketones, ur 5 (*)    All other components within normal limits  RPR  HIV ANTIBODY (ROUTINE TESTING W REFLEX)  I-STAT BETA HCG BLOOD, ED ([facility], [facility], [facility] ONLY)  GC/CHLAMYDIA PROBE AMP ([place]) NOT AT [facility]    EKG None  Radiology No results found.  Procedures Procedures (including critical care time)  Medications Ordered in ED Medications - No data to display   Initial Impression /  Assessment and Plan / ED Course  I have reviewed the triage vital signs and the nursing notes.  Pertinent labs & imaging results that were available during my care of the patient were reviewed by me and considered in my medical decision making (see chart for details).     Patient with vaginal discharge.  BV on wet prep.  Benign exam otherwise.  Will treat but other culture sent.  Discharge home.  Final Clinical Impressions(s) / ED Diagnoses   Final diagnoses:  Bacterial vaginosis    ED Discharge Orders         Ordered    metroNIDAZOLE (FLAGYL) 500 MG tablet  2 times daily     10/17/18 1625           Benjiman Core, MD 10/17/18 1627

## 2018-10-18 LAB — GC/CHLAMYDIA PROBE AMP (~~LOC~~) NOT AT ARMC
CHLAMYDIA, DNA PROBE: POSITIVE — AB
Neisseria Gonorrhea: NEGATIVE

## 2018-10-18 LAB — HIV ANTIBODY (ROUTINE TESTING W REFLEX): HIV Screen 4th Generation wRfx: NONREACTIVE

## 2018-10-18 LAB — RPR: RPR Ser Ql: NONREACTIVE

## 2019-08-26 ENCOUNTER — Encounter (HOSPITAL_COMMUNITY): Payer: Self-pay | Admitting: Emergency Medicine

## 2019-08-26 ENCOUNTER — Emergency Department (HOSPITAL_COMMUNITY)
Admission: EM | Admit: 2019-08-26 | Discharge: 2019-08-26 | Disposition: A | Discharge status: Home / Self Care | Payer: Medicaid Other | Attending: Emergency Medicine | Admitting: Emergency Medicine

## 2019-08-26 DIAGNOSIS — F17210 Nicotine dependence, cigarettes, uncomplicated: Secondary | ICD-10-CM | POA: Insufficient documentation

## 2019-08-26 DIAGNOSIS — B9689 Other specified bacterial agents as the cause of diseases classified elsewhere: Secondary | ICD-10-CM

## 2019-08-26 DIAGNOSIS — Z202 Contact with and (suspected) exposure to infections with a predominantly sexual mode of transmission: Secondary | ICD-10-CM | POA: Diagnosis present

## 2019-08-26 DIAGNOSIS — N760 Acute vaginitis: Secondary | ICD-10-CM | POA: Diagnosis not present

## 2019-08-26 LAB — WET PREP, GENITAL
Sperm: NONE SEEN
Trich, Wet Prep: NONE SEEN
Yeast Wet Prep HPF POC: NONE SEEN

## 2019-08-26 LAB — URINALYSIS, ROUTINE W REFLEX MICROSCOPIC
Bilirubin Urine: NEGATIVE
Glucose, UA: NEGATIVE mg/dL
Hgb urine dipstick: NEGATIVE
Ketones, ur: 5 mg/dL — AB
Nitrite: NEGATIVE
Protein, ur: 30 mg/dL — AB
Specific Gravity, Urine: 1.026 (ref 1.005–1.030)
pH: 5 (ref 5.0–8.0)

## 2019-08-26 LAB — POC URINE PREG, ED: Preg Test, Ur: NEGATIVE

## 2019-08-26 MED ORDER — METRONIDAZOLE 500 MG PO TABS: 500.0000 mg | ORAL_TABLET | Freq: Two times a day (BID) | ORAL | 0 refills | Status: DC
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
## 2019-08-26 NOTE — ED Notes (Signed)
Pt verbalized understanding of discharge paperwork, prescriptions and follow-up care 

## 2019-08-26 NOTE — Discharge Instructions (Addendum)
Please read attached information. If you experience any new or worsening signs or symptoms please return to the emergency room for evaluation. Please follow-up with your primary care provider or specialist as discussed. Please use medication prescribed only as directed and discontinue taking if you have any concerning signs or symptoms.   °

## 2019-08-26 NOTE — ED Notes (Signed)
Pelvic done with speculum and bi manual spec to labs

## 2019-08-26 NOTE — ED Provider Notes (Signed)
[facility] EMERGENCY DEPARTMENT Provider Note   CSN: [id number] Arrival date & time: 08/26/19  3662     History   Chief Complaint Chief Complaint  Patient presents with  . STD check    HPI Kara Morrow is a 23 y.o. female.     HPI   23 year old female presents today for STD check.  She notes that she is sexually active with female partners, she notes recently having sex with someone that she did not feel was " clean".  She denies any abdominal pain pelvic pain, or any other concerning etiology.  She notes several days ago she did have vaginal discharge consistent with previous BV.  No urinary symptoms.  History reviewed. No pertinent past medical history.  There are no active problems to display for this patient.   History reviewed. No pertinent surgical history.   OB History   No obstetric history on file.      Home Medications    Prior to Admission medications   Medication Sig Start Date End Date Taking? Authorizing Provider  cephALEXin (KEFLEX) 500 MG capsule Take 1 capsule (500 mg total) by mouth 4 (four) times daily. 03/25/18   Ward, Chase Picket, PA-C  metroNIDAZOLE (FLAGYL) 500 MG tablet Take 1 tablet (500 mg total) by mouth 2 (two) times daily. 08/26/19   Homer Pfeifer, Tinnie Gens, PA-C  phenazopyridine (PYRIDIUM) 200 MG tablet Take 1 tablet (200 mg total) by mouth 3 (three) times daily as needed (Burning with urination). 03/25/18   Ward, Chase Picket, PA-C    Family History No family history on file.  Social History Social History   Tobacco Use  . Smoking status: Current Some Day Smoker    Packs/day: 0.50    Types: Cigarettes  . Smokeless tobacco: Never Used  Substance Use Topics  . Alcohol use: No    Frequency: Never  . Drug use: Yes    Types: Marijuana     Allergies   Patient has no known allergies.   Review of Systems Review of Systems  All other systems reviewed and are negative.    Physical Exam Updated Vital Signs BP  122/75 (BP Location: Right Arm)   Pulse 67   Temp 98.2 F (36.8 C) (Oral)   Resp 14   LMP 08/11/2019   SpO2 96%   Physical Exam Vitals signs and nursing note reviewed.  Constitutional:      Appearance: She is well-developed.  HENT:     Head: Normocephalic and atraumatic.  Eyes:     General: No scleral icterus.       Right eye: No discharge.        Left eye: No discharge.     Conjunctiva/sclera: Conjunctivae normal.     Pupils: Pupils are equal, round, and reactive to light.  Neck:     Musculoskeletal: Normal range of motion.     Vascular: No JVD.     Trachea: No tracheal deviation.  Pulmonary:     Effort: Pulmonary effort is normal.     Breath sounds: No stridor.  Abdominal:     General: There is no distension.     Palpations: Abdomen is soft.     Tenderness: There is no abdominal tenderness.  Genitourinary:    Comments: White discharge in the vaginal vault no cervical motion tenderness no bleeding or lesions noted Neurological:     Mental Status: She is alert and oriented to person, place, and time.     Coordination: Coordination normal.  Psychiatric:  Behavior: Behavior normal.        Thought Content: Thought content normal.        Judgment: Judgment normal.      ED Treatments / Results  Labs (all labs ordered are listed, but only abnormal results are displayed) Labs Reviewed  WET PREP, GENITAL - Abnormal; Notable for the following components:      Result Value   Clue Cells Wet Prep HPF POC PRESENT (*)    WBC, Wet Prep HPF POC MODERATE (*)    All other components within normal limits  URINALYSIS, ROUTINE W REFLEX MICROSCOPIC - Abnormal; Notable for the following components:   Color, Urine AMBER (*)    APPearance CLOUDY (*)    Ketones, ur 5 (*)    Protein, ur 30 (*)    Leukocytes,Ua TRACE (*)    Bacteria, UA MANY (*)    All other components within normal limits  POC URINE PREG, ED  GC/CHLAMYDIA PROBE AMP ([place]) NOT AT [facility]    EKG None   Radiology No results found.  Procedures Procedures (including critical care time)  Medications Ordered in ED Medications - No data to display   Initial Impression / Assessment and Plan / ED Course  I have reviewed the triage vital signs and the nursing notes.  Pertinent labs & imaging results that were available during my care of the patient were reviewed by me and considered in my medical decision making (see chart for details).        23 year old female presents today for STD check.  She does have white discharge on exam, although she is not having discharge presently she reports recent symptoms consistent with BV.  After shared decision-making discussion patient would like metronidazole, she will wait for GC cultures.  Strep return precautions given.  She verbalized understanding and agreement to today's plan.  Final Clinical Impressions(s) / ED Diagnoses   Final diagnoses:  Bacterial vaginosis    ED Discharge Orders         Ordered    metroNIDAZOLE (FLAGYL) 500 MG tablet  2 times daily     08/26/19 1036           Okey Regal, PA-C 08/26/19 1037    Carmin Muskrat, MD 08/26/19 1553

## 2019-08-27 LAB — GC/CHLAMYDIA PROBE AMP (~~LOC~~) NOT AT ARMC
Chlamydia: POSITIVE — AB
Neisseria Gonorrhea: POSITIVE — AB

## 2019-09-01 ENCOUNTER — Emergency Department (HOSPITAL_COMMUNITY)
Admission: EM | Admit: 2019-09-01 | Discharge: 2019-09-01 | Disposition: A | Discharge status: Home / Self Care | Payer: Medicaid Other | Attending: Emergency Medicine | Admitting: Emergency Medicine

## 2019-09-01 ENCOUNTER — Encounter (HOSPITAL_COMMUNITY): Payer: Self-pay | Admitting: Emergency Medicine

## 2019-09-01 ENCOUNTER — Other Ambulatory Visit: Payer: Self-pay

## 2019-09-01 DIAGNOSIS — Z5321 Procedure and treatment not carried out due to patient leaving prior to being seen by health care provider: Secondary | ICD-10-CM | POA: Diagnosis not present

## 2019-09-01 DIAGNOSIS — R52 Pain, unspecified: Secondary | ICD-10-CM | POA: Diagnosis present
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
## 2020-07-22 ENCOUNTER — Emergency Department (HOSPITAL_COMMUNITY)
Admission: EM | Admit: 2020-07-22 | Discharge: 2020-07-22 | Disposition: A | Discharge status: Home / Self Care | Payer: Medicaid Other | Attending: Emergency Medicine | Admitting: Emergency Medicine

## 2020-07-22 ENCOUNTER — Emergency Department (HOSPITAL_COMMUNITY)
Admission: EM | Admit: 2020-07-22 | Discharge: 2020-07-22 | Discharge status: Against Medical Advice | Payer: Medicaid Other

## 2020-07-22 ENCOUNTER — Other Ambulatory Visit: Payer: Self-pay

## 2020-07-22 ENCOUNTER — Emergency Department
Admission: EM | Admit: 2020-07-22 | Discharge: 2020-07-22 | Disposition: A | Discharge status: Home / Self Care | Payer: Medicaid Other | Attending: Student in an Organized Health Care Education/Training Program | Admitting: Student in an Organized Health Care Education/Training Program

## 2020-07-22 ENCOUNTER — Encounter (HOSPITAL_COMMUNITY): Payer: Self-pay

## 2020-07-22 ENCOUNTER — Encounter: Payer: Self-pay | Admitting: Emergency Medicine

## 2020-07-22 DIAGNOSIS — Z87891 Personal history of nicotine dependence: Secondary | ICD-10-CM | POA: Diagnosis not present

## 2020-07-22 DIAGNOSIS — W260XXA Contact with knife, initial encounter: Secondary | ICD-10-CM | POA: Insufficient documentation

## 2020-07-22 DIAGNOSIS — S61411A Laceration without foreign body of right hand, initial encounter: Secondary | ICD-10-CM | POA: Insufficient documentation

## 2020-07-22 DIAGNOSIS — Z5321 Procedure and treatment not carried out due to patient leaving prior to being seen by health care provider: Secondary | ICD-10-CM | POA: Insufficient documentation

## 2020-07-22 DIAGNOSIS — W268XXA Contact with other sharp object(s), not elsewhere classified, initial encounter: Secondary | ICD-10-CM | POA: Insufficient documentation

## 2020-07-22 MED ORDER — LIDOCAINE HCL (PF) 1 % IJ SOLN
5.0000 mL | Freq: Once | INTRAMUSCULAR | Status: AC
  Administered 2020-07-22: 5 mL via INTRADERMAL
  Filled 2020-07-22: qty 5

## 2020-07-22 MED ORDER — BACITRACIN-NEOMYCIN-POLYMYXIN 400-5-5000 EX OINT
TOPICAL_OINTMENT | Freq: Once | CUTANEOUS | Status: AC
  Administered 2020-07-22: 2 via TOPICAL
  Filled 2020-07-22: qty 2

## 2020-07-22 NOTE — Discharge Instructions (Signed)
Do not get the sutured area wet for 24 hours. After 24 hours, shower/bathe as usual and pat the area dry. °Change the bandage 2 times per day and apply antibiotic ointment. °Leave open to air when at no risk of getting the area dirty, but cover at night before bed. °See your PCP or go to Urgent Care in 10 days for suture removal or sooner for signs or concern of infection. ° °
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
## 2020-07-22 NOTE — ED Notes (Signed)
Pt left stating that she was going to "bleed out while waiting". Pt is going to another hospital.
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
## 2020-07-22 NOTE — ED Triage Notes (Signed)
Pt in via POV, approximate 2" laceration to palm of right hand, reports occurred while wash dishes.  Dry dressing and ace wrap in place upon arrival.  Reports going to another hospital and waiting for hours and leaving prior to being seen.  Ambulatory to triage, NAD noted at this time.

## 2020-08-02 ENCOUNTER — Other Ambulatory Visit: Payer: Self-pay

## 2020-08-02 ENCOUNTER — Ambulatory Visit
Admission: EM | Admit: 2020-08-02 | Discharge: 2020-08-02 | Disposition: A | Discharge status: Home / Self Care | Payer: Medicaid Other

## 2020-08-02 NOTE — ED Triage Notes (Signed)
Pt presents for suture removal right hand wound placed 07/22/20.  Wound well-approximated without S/S infection.

## 2020-08-27 ENCOUNTER — Ambulatory Visit
Admission: EM | Admit: 2020-08-27 | Discharge: 2020-08-27 | Disposition: A | Discharge status: Home / Self Care | Payer: Medicaid Other | Attending: Physician Assistant | Admitting: Physician Assistant

## 2020-08-27 ENCOUNTER — Encounter: Payer: Self-pay | Admitting: *Deleted

## 2020-08-27 ENCOUNTER — Other Ambulatory Visit: Payer: Self-pay

## 2020-08-27 DIAGNOSIS — Z113 Encounter for screening for infections with a predominantly sexual mode of transmission: Secondary | ICD-10-CM | POA: Diagnosis present

## 2020-08-27 DIAGNOSIS — R300 Dysuria: Secondary | ICD-10-CM | POA: Insufficient documentation

## 2020-08-27 HISTORY — DX: Chlamydial infection, unspecified: A74.9

## 2020-08-27 LAB — POCT URINALYSIS DIP (MANUAL ENTRY)
Bilirubin, UA: NEGATIVE
Blood, UA: NEGATIVE
Glucose, UA: NEGATIVE mg/dL
Ketones, POC UA: NEGATIVE mg/dL
Nitrite, UA: NEGATIVE
Protein Ur, POC: NEGATIVE mg/dL
Spec Grav, UA: >=1.03 — AB (ref 1.010–1.025)
Urobilinogen, UA: 0.2 E.U./dL
pH, UA: 6.5 (ref 5.0–8.0)

## 2020-08-27 LAB — POCT URINE PREGNANCY: Preg Test, Ur: NEGATIVE

## 2020-08-27 NOTE — ED Provider Notes (Signed)
[facility]    CSN: [id number] Arrival date & time: 08/27/20  1015      History   Chief Complaint Chief Complaint  Patient presents with  . Dysuria  . STD Check    HPI Echo Propp is a 24 y.o. female.   24 year old female comes in for 2-3 day history of urinary symptoms. Has had dysuria, urinary urgency. States urinary symptoms have improved, but still present. Denies hematuria. Denies abdominal pain, nausea, vomiting. Denies fever, chills, flank/back pain. Denies vaginal discharge, itching, spotting. LMP 08/08/2020. Sexually active 1 female partner, inconsistent condom use. Would like to have STD testing, no known exposure.      Past Medical History:  Diagnosis Date  . Chlamydia     There are no problems to display for this patient.   History reviewed. No pertinent surgical history.  OB History   No obstetric history on file.      Home Medications    Prior to Admission medications   Not on File    Family History Family History  Problem Relation Age of Onset  . Diabetes Mother   . Healthy Father     Social History Social History   Tobacco Use  . Smoking status: Former Smoker    Packs/day: 0.50  . Smokeless tobacco: Never Used  Vaping Use  . Vaping Use: Every day  Substance Use Topics  . Alcohol use: No  . Drug use: Yes    Types: Marijuana     Allergies   Patient has no known allergies.   Review of Systems Review of Systems  Reason unable to perform ROS: See HPI as above.     Physical Exam Triage Vital Signs ED Triage Vitals [08/27/20 1024]  Enc Vitals Group     BP 129/78     Pulse Rate 78     Resp 16     Temp 98 F (36.7 C)     Temp src      SpO2 99 %     Weight      Height      Head Circumference      Peak Flow      Pain Score 0     Pain Loc      Pain Edu?      Excl. in GC?    No data found.  Updated Vital Signs BP 129/78   Pulse 78   Temp 98 F (36.7 C)   Resp 16   LMP 08/08/2020 (Exact Date)    SpO2 99%   Physical Exam Constitutional:      General: She is not in acute distress.    Appearance: She is well-developed. She is not ill-appearing, toxic-appearing or diaphoretic.  HENT:     Head: Normocephalic and atraumatic.  Eyes:     Conjunctiva/sclera: Conjunctivae normal.     Pupils: Pupils are equal, round, and reactive to light.  Cardiovascular:     Rate and Rhythm: Normal rate and regular rhythm.  Pulmonary:     Effort: Pulmonary effort is normal. No respiratory distress.     Comments: LCTAB Abdominal:     General: Bowel sounds are normal.     Palpations: Abdomen is soft.     Tenderness: There is no abdominal tenderness. There is no right CVA tenderness, left CVA tenderness, guarding or rebound.  Musculoskeletal:     Cervical back: Normal range of motion and neck supple.  Skin:    General: Skin is warm and  dry.  Neurological:     Mental Status: She is alert and oriented to person, place, and time.  Psychiatric:        Behavior: Behavior normal.        Judgment: Judgment normal.      UC Treatments / Results  Labs (all labs ordered are listed, but only abnormal results are displayed) Labs Reviewed  POCT URINALYSIS DIP (MANUAL ENTRY) - Abnormal; Notable for the following components:      Result Value   Spec Grav, UA >=1.030 (*)    Leukocytes, UA Trace (*)    All other components within normal limits  URINE CULTURE  POCT URINE PREGNANCY  CERVICOVAGINAL ANCILLARY ONLY    EKG   Radiology No results found.  Procedures Procedures (including critical care time)  Medications Ordered in UC Medications - No data to display  Initial Impression / Assessment and Plan / UC Course  I have reviewed the triage vital signs and the nursing notes.  Pertinent labs & imaging results that were available during my care of the patient were reviewed by me and considered in my medical decision making (see chart for details).    Trace leuks with elevated specific gravity.  Will send for urine culture prior to treatment. Push fluids for now and monitor symptoms. Cytology sent. Return precautions given.  Final Clinical Impressions(s) / UC Diagnoses   Final diagnoses:  Dysuria  Screening for STD (sexually transmitted disease)    ED Prescriptions    None     PDMP not reviewed this encounter.   Belinda Fisher, PA-C 08/27/20 1047

## 2020-08-27 NOTE — ED Triage Notes (Signed)
C/O dysuria, polyuria, urinary urgency without fever x 2-3 days.  Denies abd pain.  States had some back pain, now resolved. Also wishes to be checked for STDs.  Denies any vaginal discharge.

## 2020-08-27 NOTE — Discharge Instructions (Signed)
Urine showed a little bacteria, this may be skin cells, we are sending it for culture. Keep hydrated, urine should be clear to pale yellow in color. Monitor for any worsening of symptoms, fever, worsening abdominal pain, nausea/vomiting, flank pain, follow up for reevaluation.   Cytology sent, you will be contacted with any positive results that requires further treatment. Monitor for any worsening of symptoms, fever, abdominal pain, nausea, vomiting, to follow up for reevaluation.

## 2020-08-28 LAB — URINE CULTURE: Culture: NO GROWTH

## 2020-08-28 LAB — CERVICOVAGINAL ANCILLARY ONLY
Comment: NEGATIVE
Trichomonas: NEGATIVE

## 2020-10-09 ENCOUNTER — Ambulatory Visit
Admission: EM | Admit: 2020-10-09 | Discharge: 2020-10-09 | Disposition: A | Discharge status: Home / Self Care | Payer: Medicaid Other | Attending: Family Medicine | Admitting: Family Medicine

## 2020-10-09 DIAGNOSIS — M7918 Myalgia, other site: Secondary | ICD-10-CM | POA: Insufficient documentation

## 2020-10-09 DIAGNOSIS — N3001 Acute cystitis with hematuria: Secondary | ICD-10-CM | POA: Diagnosis present

## 2020-10-09 DIAGNOSIS — N898 Other specified noninflammatory disorders of vagina: Secondary | ICD-10-CM

## 2020-10-09 LAB — POCT URINALYSIS DIP (MANUAL ENTRY)
Glucose, UA: NEGATIVE mg/dL
Nitrite, UA: NEGATIVE
Protein Ur, POC: 30 mg/dL — AB
Spec Grav, UA: >=1.03 — AB (ref 1.010–1.025)
Urobilinogen, UA: 0.2 E.U./dL
pH, UA: 6 (ref 5.0–8.0)

## 2020-10-09 LAB — POCT URINE PREGNANCY: Preg Test, Ur: NEGATIVE

## 2020-10-09 MED ORDER — MELOXICAM 15 MG PO TABS: 15.0000 mg | ORAL_TABLET | Freq: Every day | ORAL | 0 refills | Status: AC

## 2020-10-09 MED ORDER — NITROFURANTOIN MONOHYD MACRO 100 MG PO CAPS: 100.0000 mg | ORAL_CAPSULE | Freq: Two times a day (BID) | ORAL | 0 refills | Status: DC

## 2020-10-09 MED ORDER — CYCLOBENZAPRINE HCL 5 MG PO TABS: 5.0000 mg | ORAL_TABLET | Freq: Every day | ORAL | 0 refills | Status: AC
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
## 2020-10-10 LAB — URINE CULTURE: Special Requests: NORMAL

## 2020-10-12 ENCOUNTER — Other Ambulatory Visit (INDEPENDENT_AMBULATORY_CARE_PROVIDER_SITE_OTHER): Payer: Self-pay | Admitting: Family Medicine

## 2020-10-12 LAB — CERVICOVAGINAL ANCILLARY ONLY
Bacterial Vaginitis (gardnerella): POSITIVE — AB
Candida Glabrata: NEGATIVE
Candida Vaginitis: NEGATIVE
Chlamydia: POSITIVE — AB
Comment: NEGATIVE
Comment: NEGATIVE
Comment: NEGATIVE
Comment: NEGATIVE
Comment: NEGATIVE
Comment: NORMAL
Neisseria Gonorrhea: POSITIVE — AB
Trichomonas: NEGATIVE

## 2020-10-13 ENCOUNTER — Telehealth (HOSPITAL_COMMUNITY): Payer: Self-pay | Admitting: Emergency Medicine

## 2020-10-13 MED ORDER — DOXYCYCLINE HYCLATE 100 MG PO CAPS: 100.0000 mg | ORAL_CAPSULE | Freq: Two times a day (BID) | ORAL | 0 refills | Status: AC

## 2020-10-13 MED ORDER — METRONIDAZOLE 500 MG PO TABS: 500.0000 mg | ORAL_TABLET | Freq: Two times a day (BID) | ORAL | 0 refills | Status: AC

## 2021-04-26 ENCOUNTER — Other Ambulatory Visit: Payer: Self-pay

## 2021-04-26 ENCOUNTER — Ambulatory Visit (HOSPITAL_COMMUNITY)
Admission: EM | Admit: 2021-04-26 | Discharge: 2021-04-26 | Disposition: A | Discharge status: Home / Self Care | Payer: Medicaid Other | Attending: Psychiatry | Admitting: Psychiatry

## 2021-04-26 DIAGNOSIS — R45851 Suicidal ideations: Secondary | ICD-10-CM | POA: Insufficient documentation

## 2021-04-26 DIAGNOSIS — F4010 Social phobia, unspecified: Secondary | ICD-10-CM | POA: Insufficient documentation

## 2021-04-26 NOTE — BH Assessment (Signed)
Triage Note- ROUTINE- Kara Ala, NP  also met with pt. Pt denies SI, HI and AVH. She would like help coping with social anxiety, single-parent stress and depressed feelings.

## 2021-04-26 NOTE — ED Notes (Signed)
Pt discharged in no acute distress. Resources and recommendations given. Safety maintained.

## 2021-04-26 NOTE — ED Provider Notes (Signed)
[facility] Medical Screening Exam  Patient Name: Kara Morrow MRN: [id number] Date of Evaluation: 04/26/21 Chief Complaint:   Diagnosis:  Final diagnoses:  Social anxiety disorder    History of Present illness: Kara Morrow is a 25 y.o. female.  Presents to [facility] urgent care accompanied with her sister.  She reports " I think I have social anxiety I do not like people."  She denied suicidal or homicidal ideations.  Denies auditory or visual hallucinations.  Denies that she has been followed by therapy and psychiatry in the past.  Reports passive suicidal ideations however denied plan or intent. Reported " I feel depressed at times."  Denied any previous inpatient admissions.  Denied that she has been prescribed any psychotropic medications in the past.   Kara Morrow is requesting additional outpatient resources for therapy. " I will feel better I if talk it out, I don't need any medications."     Kara Morrow reports more recently feeling overwhelmed and stressed due to a newborn.  NP Case management provided outpatient resources for follow-up.  Support, encouragement and reassurance was provided.   Psychiatric Specialty Exam  Presentation  General Appearance:Appropriate for Environment  Eye Contact:Good  Speech:Clear and Coherent  Speech Volume:Normal  Handedness:Right   Mood and Affect  Mood:Anxious  Affect:Congruent   Thought Process  Thought Processes:Coherent  Descriptions of Associations:Intact  Orientation:Full (Time, Place and Person)  Thought Content:Logical    Hallucinations:None  Ideas of Reference:None  Suicidal Thoughts:No  Homicidal Thoughts:No   Sensorium  Memory:Recent Good; Immediate Good; Remote Good  Judgment:Fair  Insight:Good   Executive Functions  Concentration:Good  Attention Span:Good  Recall:Fair  Fund of Knowledge:Fair  Language:Good   Psychomotor Activity  Psychomotor Activity:Normal   Assets   Assets:Social Support   Sleep  Sleep:Good  Number of hours:  No data recorded  Nutritional Assessment (For [facility] and [facility] admissions only) Has the patient had a weight loss or gain of 10 pounds or more in the last 3 months?: No Has the patient had a decrease in food intake/or appetite?: No Does the patient have dental problems?: No Does the patient have eating habits or behaviors that may be indicators of an eating disorder including binging or inducing vomiting?: No Has the patient recently lost weight without trying?: No Has the patient been eating poorly because of a decreased appetite?: No Malnutrition Screening Tool Score: 0   Physical Exam: Physical Exam Vitals reviewed.  Psychiatric:        Attention and Perception: Attention and perception normal.        Mood and Affect: Mood normal.        Speech: Speech normal.        Behavior: Behavior normal.        Thought Content: Thought content normal.        Cognition and Memory: Cognition normal.        Judgment: Judgment normal.   Review of Systems  Psychiatric/Behavioral:  Positive for depression. Negative for suicidal ideas. The patient is nervous/anxious.   All other systems reviewed and are negative. Blood pressure 117/63, pulse 66, temperature 97.9 F (36.6 C), temperature source Oral, resp. rate 16, SpO2 100 %. There is no height or weight on file to calculate BMI.  Musculoskeletal: Strength & Muscle Tone: within normal limits Gait & Station: normal Patient leans: N/A   [facility] MSE Discharge Disposition for Follow up and Recommendations: Based on my evaluation the patient does not appear to have an emergency medical condition  and can be discharged with resources and follow up care in outpatient services for Medication Management, Individual Therapy, and Group Therapy   Oneta Rack, NP 04/26/2021, 11:37 AM

## 2021-04-26 NOTE — Discharge Instructions (Addendum)
Take all medications as prescribed. Keep all follow-up appointments as scheduled.  Do not consume alcohol or use illegal drugs while on prescription medications. Report any adverse effects from your medications to your primary care provider promptly.  In the event of recurrent symptoms or worsening symptoms, call 911, a crisis hotline, or go to the nearest emergency department for evaluation.   

## 2024-11-29 ENCOUNTER — Ambulatory Visit
Admission: EM | Admit: 2024-11-29 | Discharge: 2024-11-29 | Disposition: A | Discharge status: Home / Self Care | Payer: Self-pay

## 2024-11-29 ENCOUNTER — Encounter: Payer: Self-pay | Admitting: Emergency Medicine

## 2024-11-29 DIAGNOSIS — Z202 Contact with and (suspected) exposure to infections with a predominantly sexual mode of transmission: Secondary | ICD-10-CM | POA: Insufficient documentation

## 2024-11-29 LAB — CERVICOVAGINAL ANCILLARY ONLY
Bacterial Vaginitis (gardnerella): POSITIVE — AB
Candida Glabrata: NEGATIVE
Candida Vaginitis: POSITIVE — AB
Chlamydia: NEGATIVE
Comment: NEGATIVE
Comment: NEGATIVE
Comment: NEGATIVE
Comment: NEGATIVE
Comment: NEGATIVE
Comment: NORMAL
Neisseria Gonorrhea: NEGATIVE
Trichomonas: NEGATIVE

## 2024-11-29 LAB — POCT URINE PREGNANCY: Preg Test, Ur: NEGATIVE

## 2024-11-29 MED ORDER — DOXYCYCLINE HYCLATE 100 MG PO CAPS: 100.0000 mg | ORAL_CAPSULE | Freq: Two times a day (BID) | ORAL | 0 refills | Status: AC

## 2024-11-29 MED ORDER — CEFTRIAXONE SODIUM 500 MG IJ SOLR
500.0000 mg | INTRAMUSCULAR | Status: DC
  Administered 2024-11-29: 500 mg via INTRAMUSCULAR
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
## 2024-11-29 NOTE — Discharge Instructions (Signed)
 You were tested for STIs today. You should receive your results in 3-5 days. If you have not received a call from the office or see results in your mychart please call the clinic where you were seen. It is best if your refrain from sexual activity until you get results back. If positive you will need to refrain from sexual activity for 2 weeks and be sure to complete any antibiotics prescribed in their entirety. '

## 2024-11-30 LAB — SYPHILIS: RPR W/REFLEX TO RPR TITER AND TREPONEMAL ANTIBODIES, TRADITIONAL SCREENING AND DIAGNOSIS ALGORITHM: RPR Ser Ql: NONREACTIVE

## 2024-11-30 LAB — HIV ANTIBODY (ROUTINE TESTING W REFLEX): HIV Screen 4th Generation wRfx: NONREACTIVE

## 2024-12-02 ENCOUNTER — Ambulatory Visit (HOSPITAL_COMMUNITY): Payer: Self-pay

## 2024-12-02 MED ORDER — FLUCONAZOLE 150 MG PO TABS: 150.0000 mg | ORAL_TABLET | Freq: Every day | ORAL | 0 refills | Status: AC
